# Patient Record
Sex: Female | Born: 1935 | Hispanic: No
Health system: Southern US, Community
[De-identification: ages and names within clinical notes are randomized; demographics above are authoritative.]

---

## 2004-02-08 ENCOUNTER — Other Ambulatory Visit: Payer: Self-pay

## 2004-07-27 ENCOUNTER — Inpatient Hospital Stay: Payer: Self-pay | Admitting: Oncology

## 2004-07-27 ENCOUNTER — Other Ambulatory Visit: Payer: Self-pay

## 2004-08-11 ENCOUNTER — Ambulatory Visit: Payer: Self-pay | Admitting: Oncology

## 2004-08-26 ENCOUNTER — Ambulatory Visit: Payer: Self-pay | Admitting: Oncology

## 2004-09-25 ENCOUNTER — Ambulatory Visit: Payer: Self-pay | Admitting: Oncology

## 2004-10-26 ENCOUNTER — Ambulatory Visit: Payer: Self-pay | Admitting: Oncology

## 2004-12-05 ENCOUNTER — Ambulatory Visit: Payer: Self-pay | Admitting: Oncology

## 2004-12-24 ENCOUNTER — Ambulatory Visit: Payer: Self-pay | Admitting: Oncology

## 2005-01-24 ENCOUNTER — Ambulatory Visit: Payer: Self-pay | Admitting: Oncology

## 2005-02-26 ENCOUNTER — Ambulatory Visit: Payer: Self-pay | Admitting: Oncology

## 2006-01-13 ENCOUNTER — Ambulatory Visit: Payer: Self-pay | Admitting: General Surgery

## 2006-01-20 ENCOUNTER — Ambulatory Visit: Payer: Self-pay | Admitting: Oncology

## 2006-01-24 ENCOUNTER — Ambulatory Visit: Payer: Self-pay | Admitting: Oncology

## 2006-07-23 ENCOUNTER — Ambulatory Visit: Payer: Self-pay | Admitting: Oncology

## 2006-07-26 ENCOUNTER — Ambulatory Visit: Payer: Self-pay | Admitting: Oncology

## 2007-01-31 ENCOUNTER — Ambulatory Visit: Payer: Self-pay | Admitting: Oncology

## 2007-02-24 ENCOUNTER — Ambulatory Visit: Payer: Self-pay | Admitting: Oncology

## 2007-07-27 ENCOUNTER — Ambulatory Visit: Payer: Self-pay | Admitting: Oncology

## 2007-08-25 ENCOUNTER — Ambulatory Visit: Payer: Self-pay | Admitting: Oncology

## 2007-08-27 ENCOUNTER — Ambulatory Visit: Payer: Self-pay | Admitting: Oncology

## 2007-09-26 ENCOUNTER — Ambulatory Visit: Payer: Self-pay | Admitting: Oncology

## 2008-03-05 ENCOUNTER — Ambulatory Visit: Payer: Self-pay | Admitting: Oncology

## 2008-03-26 ENCOUNTER — Ambulatory Visit: Payer: Self-pay | Admitting: Oncology

## 2008-03-29 ENCOUNTER — Ambulatory Visit: Payer: Self-pay | Admitting: Oncology

## 2008-04-25 ENCOUNTER — Ambulatory Visit: Payer: Self-pay | Admitting: Oncology

## 2009-03-26 ENCOUNTER — Ambulatory Visit: Payer: Self-pay | Admitting: Oncology

## 2009-04-03 ENCOUNTER — Ambulatory Visit: Payer: Self-pay | Admitting: Oncology

## 2009-04-25 ENCOUNTER — Ambulatory Visit: Payer: Self-pay | Admitting: Oncology

## 2010-04-25 ENCOUNTER — Ambulatory Visit: Payer: Self-pay | Admitting: Oncology

## 2010-05-14 ENCOUNTER — Ambulatory Visit: Payer: Self-pay | Admitting: Oncology

## 2010-05-16 LAB — CANCER ANTIGEN 27.29: CA 27.29: 15.7 U/mL

## 2010-05-26 ENCOUNTER — Ambulatory Visit: Payer: Self-pay | Admitting: Oncology

## 2010-11-20 ENCOUNTER — Ambulatory Visit: Payer: Self-pay | Admitting: Oncology

## 2010-11-24 LAB — CANCER ANTIGEN 27.29: CA 27.29: 24.1 U/mL (ref 0.0–38.6)

## 2010-11-26 ENCOUNTER — Ambulatory Visit: Payer: Self-pay | Admitting: Oncology

## 2011-08-27 ENCOUNTER — Ambulatory Visit: Payer: Self-pay | Admitting: Oncology

## 2011-08-28 LAB — CANCER ANTIGEN 27.29: CA 27.29: 23 U/mL (ref 0.0–38.6)

## 2011-09-26 ENCOUNTER — Ambulatory Visit: Payer: Self-pay | Admitting: Oncology

## 2012-12-12 LAB — CBC WITH DIFFERENTIAL/PLATELET
Basophil %: 0.9 %
Eosinophil %: 1.4 %
HGB: 10.8 g/dL — ABNORMAL LOW (ref 12.0–16.0)
Lymphocyte #: 1.9 10*3/uL (ref 1.0–3.6)
MCH: 30.8 pg (ref 26.0–34.0)
MCHC: 34 g/dL (ref 32.0–36.0)
MCV: 91 fL (ref 80–100)
Monocyte %: 8.7 %
Neutrophil #: 5.3 10*3/uL (ref 1.4–6.5)
RDW: 13 % (ref 11.5–14.5)
WBC: 8.1 10*3/uL (ref 3.6–11.0)

## 2012-12-12 LAB — URINALYSIS, COMPLETE
Bacteria: NONE SEEN
Bilirubin,UR: NEGATIVE
Glucose,UR: 150 mg/dL (ref 0–75)
Nitrite: NEGATIVE
Ph: 5 (ref 4.5–8.0)
Protein: NEGATIVE
RBC,UR: 3 /HPF (ref 0–5)
Specific Gravity: 1.011 (ref 1.003–1.030)
WBC UR: 10 /HPF (ref 0–5)

## 2012-12-12 LAB — COMPREHENSIVE METABOLIC PANEL
Albumin: 3.3 g/dL — ABNORMAL LOW (ref 3.4–5.0)
Alkaline Phosphatase: 79 U/L (ref 50–136)
BUN: 19 mg/dL — ABNORMAL HIGH (ref 7–18)
Bilirubin,Total: 0.4 mg/dL (ref 0.2–1.0)
Calcium, Total: 9 mg/dL (ref 8.5–10.1)
Chloride: 106 mmol/L (ref 98–107)
Creatinine: 0.81 mg/dL (ref 0.60–1.30)
EGFR (African American): >60
Potassium: 6.4 mmol/L — ABNORMAL HIGH (ref 3.5–5.1)
SGOT(AST): 41 U/L — ABNORMAL HIGH (ref 15–37)
SGPT (ALT): 23 U/L (ref 12–78)
Sodium: 136 mmol/L (ref 136–145)
Total Protein: 7.6 g/dL (ref 6.4–8.2)

## 2012-12-12 LAB — LIPASE, BLOOD: Lipase: 296 U/L (ref 73–393)

## 2012-12-13 ENCOUNTER — Inpatient Hospital Stay: Payer: Self-pay | Admitting: Surgery

## 2012-12-13 LAB — BASIC METABOLIC PANEL
Anion Gap: 8 (ref 7–16)
BUN: 17 mg/dL (ref 7–18)
Calcium, Total: 8.2 mg/dL — ABNORMAL LOW (ref 8.5–10.1)
Chloride: 105 mmol/L (ref 98–107)
Co2: 22 mmol/L (ref 21–32)
Creatinine: 0.93 mg/dL (ref 0.60–1.30)
EGFR (African American): >60
EGFR (Non-African Amer.): 60 — ABNORMAL LOW
Glucose: 274 mg/dL — ABNORMAL HIGH (ref 65–99)
Osmolality: 281 (ref 275–301)
Potassium: 4.3 mmol/L (ref 3.5–5.1)
Sodium: 135 mmol/L — ABNORMAL LOW (ref 136–145)

## 2012-12-14 LAB — CBC WITH DIFFERENTIAL/PLATELET
Basophil #: 0 10*3/uL (ref 0.0–0.1)
Basophil %: 0.6 %
Eosinophil #: 0.1 10*3/uL (ref 0.0–0.7)
Eosinophil %: 2.4 %
HCT: 26.5 % — ABNORMAL LOW (ref 35.0–47.0)
HGB: 8.6 g/dL — ABNORMAL LOW (ref 12.0–16.0)
Lymphocyte #: 1.5 10*3/uL (ref 1.0–3.6)
Lymphocyte %: 32 %
MCH: 30.1 pg (ref 26.0–34.0)
MCHC: 32.6 g/dL (ref 32.0–36.0)
MCV: 92 fL (ref 80–100)
Neutrophil %: 55 %
RBC: 2.87 10*6/uL — ABNORMAL LOW (ref 3.80–5.20)
RDW: 12.7 % (ref 11.5–14.5)
WBC: 4.7 10*3/uL (ref 3.6–11.0)

## 2012-12-14 LAB — BASIC METABOLIC PANEL
Chloride: 115 mmol/L — ABNORMAL HIGH (ref 98–107)
Co2: 23 mmol/L (ref 21–32)
Creatinine: 1 mg/dL (ref 0.60–1.30)
EGFR (African American): >60
Osmolality: 294 (ref 275–301)
Potassium: 4.2 mmol/L (ref 3.5–5.1)
Sodium: 146 mmol/L — ABNORMAL HIGH (ref 136–145)

## 2012-12-15 LAB — URINE CULTURE

## 2014-04-23 ENCOUNTER — Ambulatory Visit: Payer: Self-pay | Admitting: Oncology

## 2014-04-25 ENCOUNTER — Ambulatory Visit: Payer: Self-pay | Admitting: Oncology

## 2014-05-26 ENCOUNTER — Ambulatory Visit: Payer: Self-pay | Admitting: Oncology

## 2014-06-19 IMAGING — CT CT ABD-PELV W/ CM
1 of 2 series · 15 of 32 positions shown, 19 images · IV contrast (isovue)
Comparison: None

REASON FOR EXAM: (1) llq pain acute onset; (2) llq pain
COMMENTS:

PROCEDURE:     CT  - CT ABDOMEN / PELVIS  W  - December 12, 2012 [DATE]
RESULT:     History: Left lower quadrant pain
TECHNIQUE: Multiple axial images of the abdomen and pelvis were performed
from the lung bases to the pubic symphysis, with p.o. contrast and with 80
ml of Isovue 300 intravenous contrast.

[Series 2: 3mm soft tissue · axial · 0.63mm/px · z∈[-407,-77]mm · 15 of 122 slices shown, 19 images]
[im 6/122  soft-tissue]
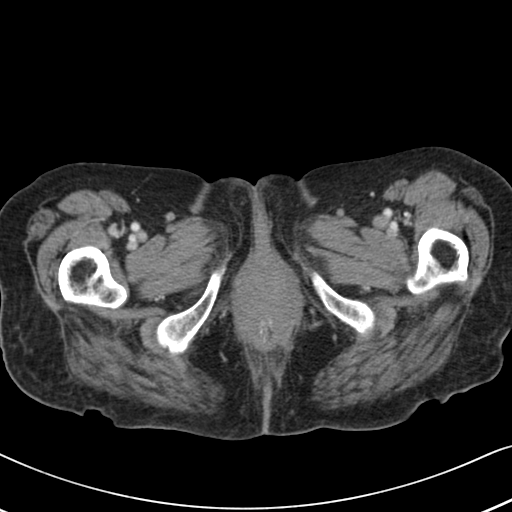
[im 6/122  bone]
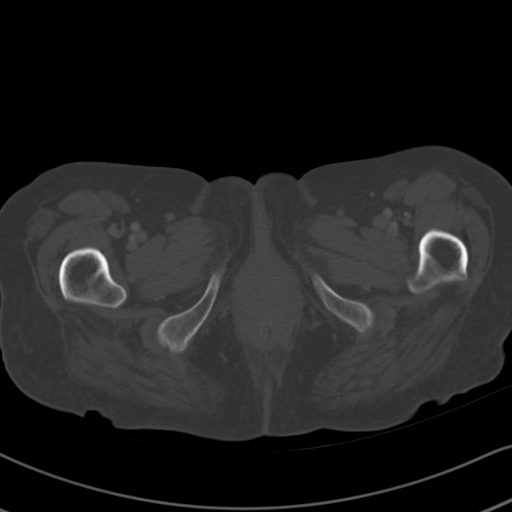
[im 16/122  soft-tissue]
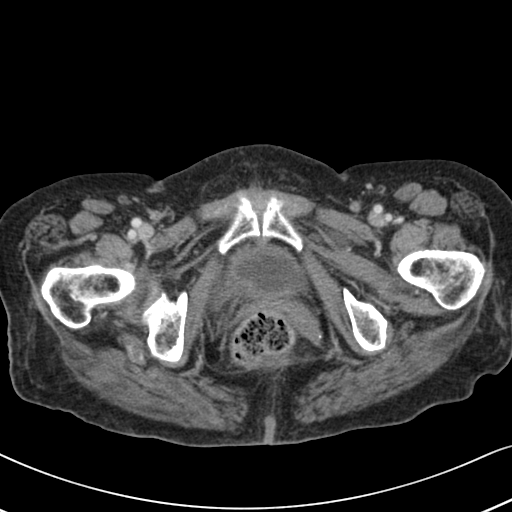
[im 26/122  soft-tissue]
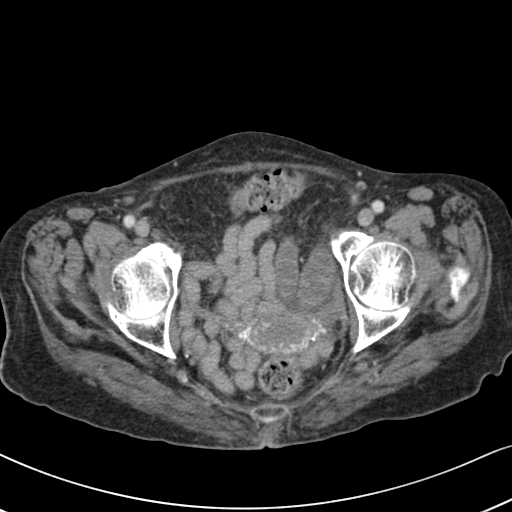
[im 36/122  soft-tissue]
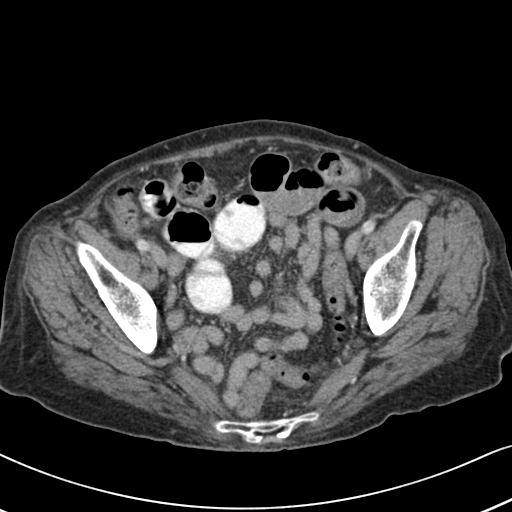
[im 41/122  soft-tissue]
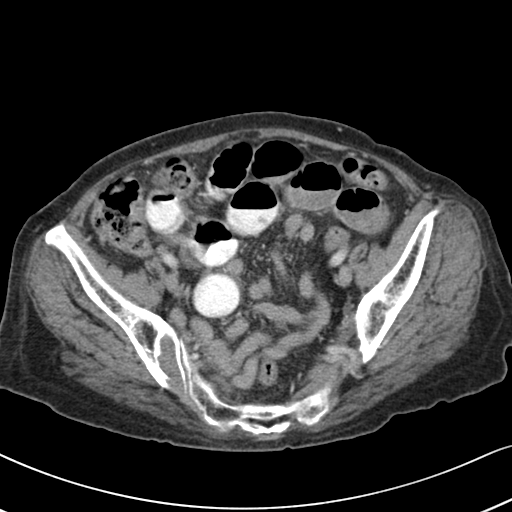
[im 51/122  soft-tissue]
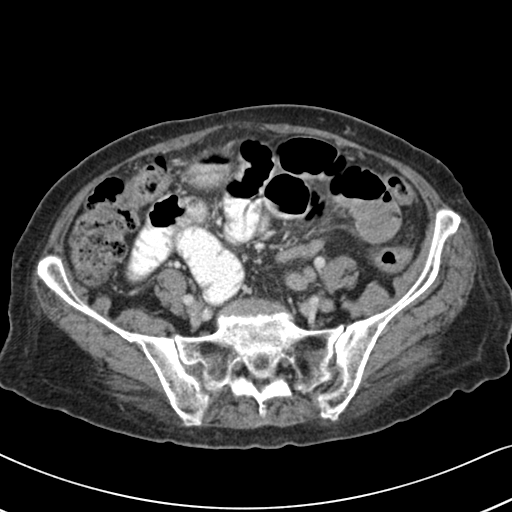
[im 61/122  soft-tissue]
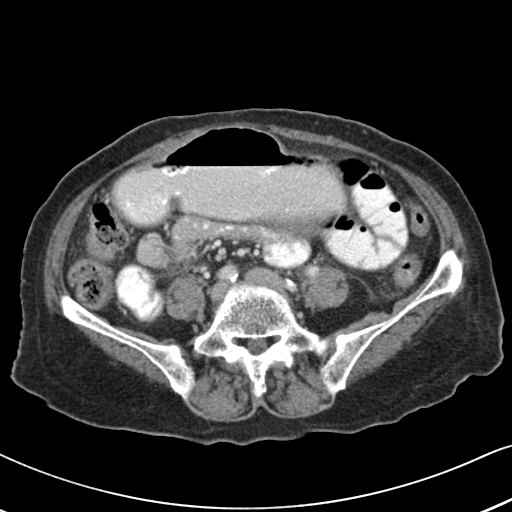
[im 71/122  soft-tissue]
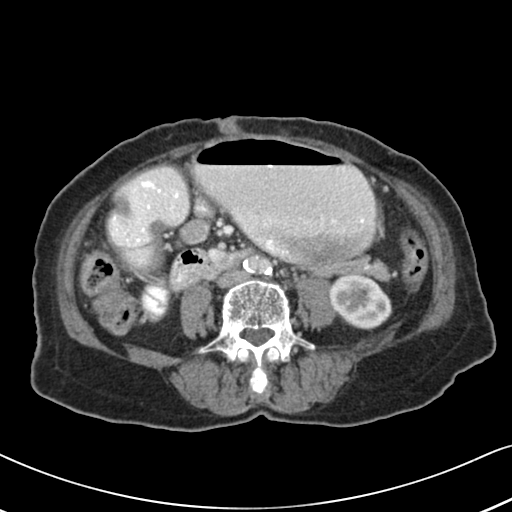
[im 81/122  soft-tissue]
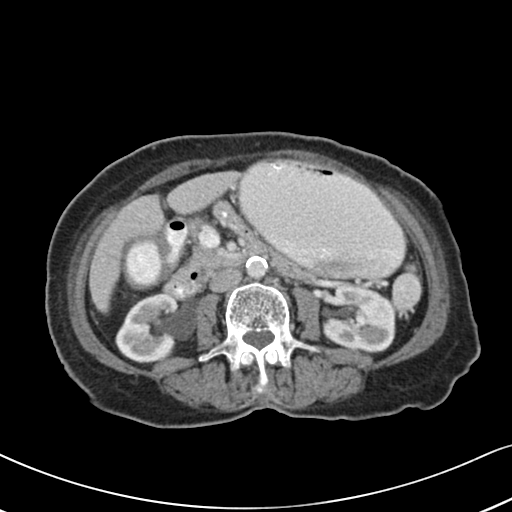
[im 81/122  bone]
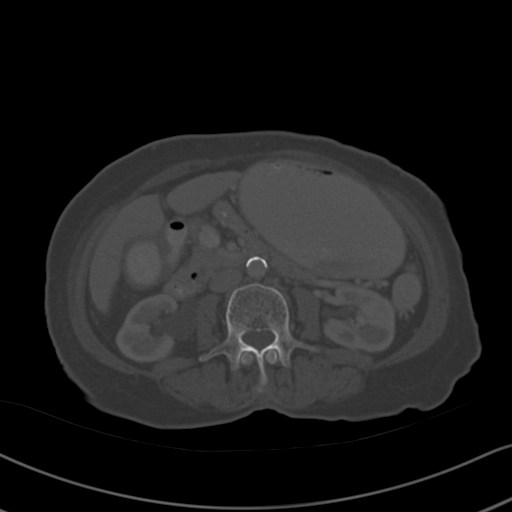
[im 86/122  soft-tissue]
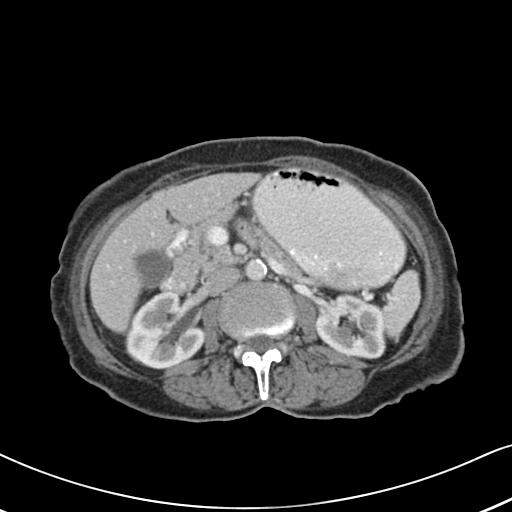
[im 96/122  soft-tissue]
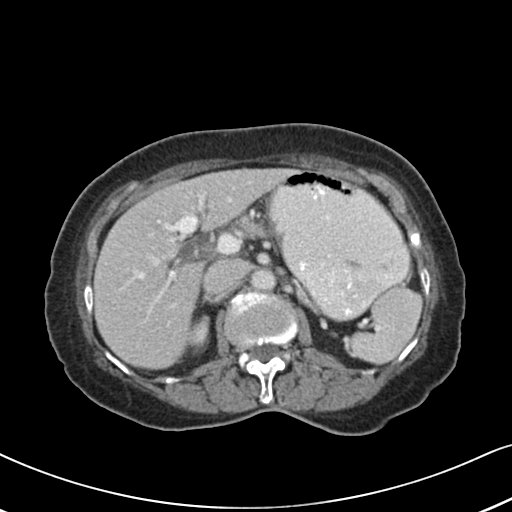
[im 101/122  lung]
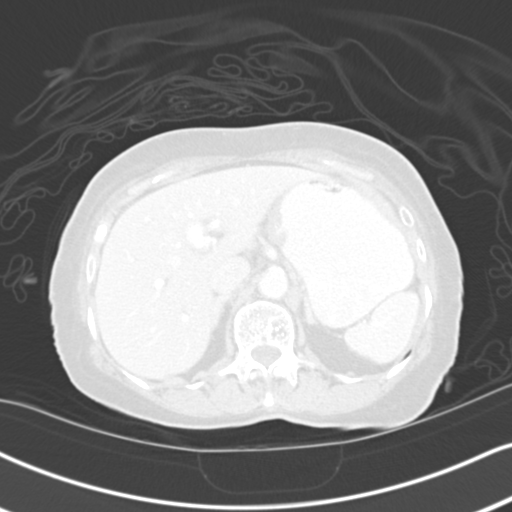
[im 106/122  soft-tissue]
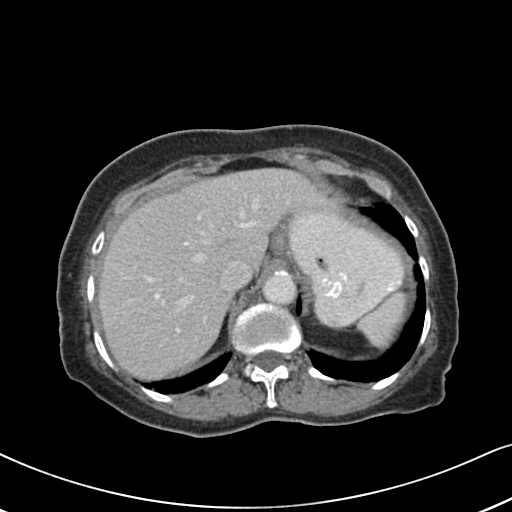
[im 106/122  lung]
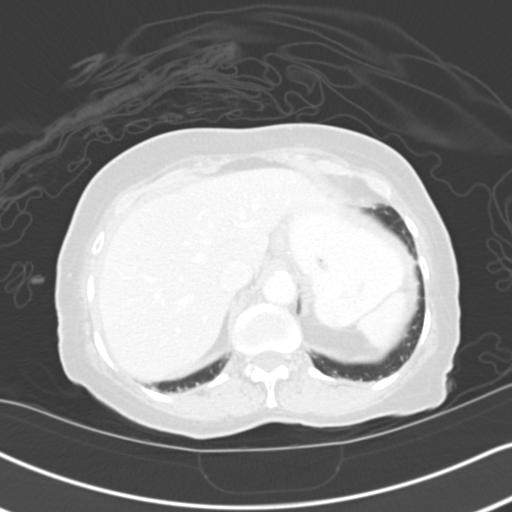
[im 111/122  lung]
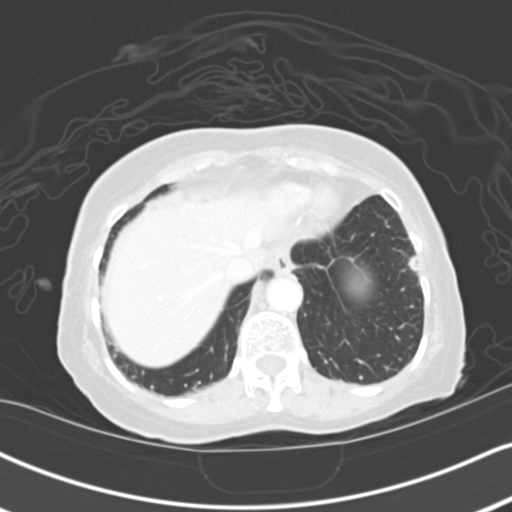
[im 116/122  soft-tissue]
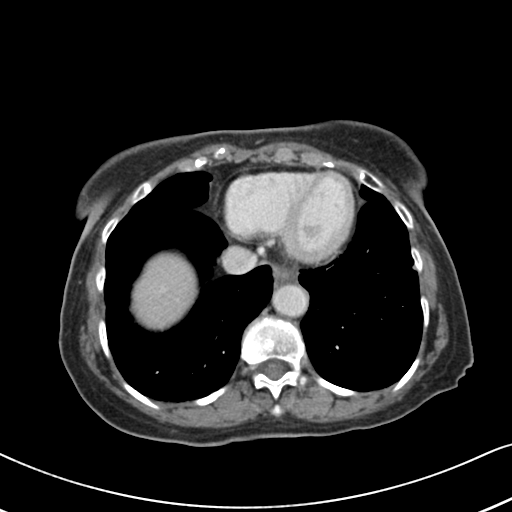
[im 116/122  lung]
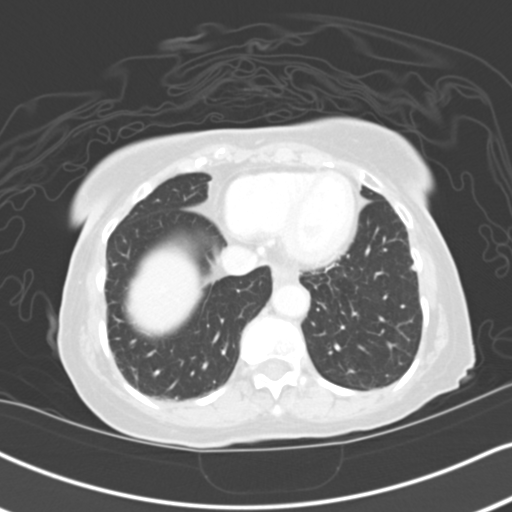

[15 of 32 positions shown; findings below may reference images not displayed]

FINDINGS: The lung bases are clear. There is no pneumothorax. The heart size is
normal.

The liver demonstrates no focal abnormality. There is no intrahepatic or
extrahepatic biliary ductal dilatation. The gallbladder is unremarkable. The
spleen demonstrates no focal abnormality. The kidneys, adrenal glands, and
pancreas are normal. There is relative bladder wall thickening which may
secondary to underdistention versus cystitis.

There is gastric distention. There are multiple dilated loops of small bowel
measuring up to 3 cm with multiple air-fluid levels. The ileum is
decompressed. There is relative bowel wall thickening involving the
ascending and descending colon which may be secondary to under distention
versus colitis secondary to an infectious or inflammatory etiology. There is
no pneumoperitoneum, pneumatosis, or portal venous gas. There is no
abdominal or pelvic free fluid. There is no lymphadenopathy.

The abdominal aorta is normal in caliber with atherosclerosis.

The osseous structures are unremarkable.
IMPRESSION: 1.  There is relative bowel wall thickening involving the ascending and
descending colon which may be secondary to underdistention versus colitis
secondary to an infectious or inflammatory etiology.

2. There is gastric distention. There are multiple dilated loops of small
bowel measuring up to 3 cm with multiple air-fluid levels. The ileum is
decompressed. The appearance is concerning for small bowel obstruction.

3.  There is relative bladder wall thickening which may secondary to
underdistention versus cystitis.

[REDACTED]

## 2015-02-15 NOTE — H&P (Signed)
PATIENT NAME:  Shelby Stevens, SPURRIER MR#:  [id number] DATE OF BIRTH:  [birth year]  DATE OF ADMISSION:  12/13/2012  ADMITTING DIAGNOSES:  1.  Small bowel obstruction.  2.  History of breast cancer, remote.  3.  Hypertension.  4.  Diet-controlled diabetes.  5.  Left hip and leg pain.   HISTORY: This is a 81 year old Asian female, who presented to the Emergency Room with the sudden onset of lower abdominal pain earlier this evening following dinner. She had no prodrome of abdominal pain, nausea or vomiting. She had a bowel movement this morning. She had passage of gas earlier today. She does admit to having upper respiratory tract infection for which she was placed on antibiotics by her local doctor within the last week. There has been no sick contacts in the family regarding GI illness. The patient had a CT scan with oral contrast in the Emergency Room and following this had emesis of a small amount. She did get some morphine in the Emergency Room and is feeling better. CT scan returned concerning for bowel obstruction and as such surgical services were consulted.   In reviewing her history, the patient has a history of breast cancer treated with lumpectomy, chemotherapy and radiation approximately 9 years ago. This was done by Dr. Evette Cristal. She recently saw Dr. Doylene Canning and seems to be in clinical remission. She admits to no previous abdominal operations. She has had childbirth, all of which was natural. She recently came back from a long trip to [place]. The patient states that she developed left leg and hip pain as well as lower back pain along with the abdominal pain earlier this evening.   ALLERGIES: None.   MEDICATIONS:  1.  Aspirin 81 mg by mouth once a day.  2.  Atenolol 12.5 mg by mouth once a day. 3.  Lipitor 20 mg by mouth once a day. 4.  Lisinopril 2.5 mg by mouth once a day. 5.  Multivitamins with iron 1 tab p.o. daily. 6.  Os-Cal 500 plus D 1 tab t.i.d.   PAST MEDICAL HISTORY:  1.  Breast  cancer.  2.  Hypertension.  3.  Hypercholesterolemia.   PAST SURGICAL HISTORY: Left breast partial mastectomy and sentinel lymph node biopsy in April 2005. Cardiac catheterization in 2004 demonstrating ejection fraction of 65% and insignificant coronary artery disease.   SOCIAL HISTORY: The patient does not drink or smoke. She lives with her daughter here in town. She is mother-in-law of one of the local physicians.   FAMILY HISTORY: Noncontributory.  REVIEW OF SYSTEMS: As described above and as per 10-point review, otherwise unremarkable.   PHYSICAL EXAMINATION:  GENERAL: Frail appearing, Asian female accompanied by her daughters.  VITAL SIGNS: Temperature 97.6, pulse 55 and regular, respiratory rate 22 and unlabored, blood pressure 174/71, room air saturation 98%. She is 5 feet 1 inch, weight 95 pounds. BMI 17.9. NECK: Without adenopathy or thyromegaly. No supraclavicular lymphadenopathy is noted.  LUNGS: Clear.  HEART: Regular rate and rhythm. No murmurs.  ABDOMEN: Demonstrates no scars. No obvious hernias. There is high pitched bowel sounds in all 4 quadrants. There is minimal tenderness. There is tympany. There is distention. No peritoneal signs. No visible peristalsis was noted.  EXTREMITIES: Warm and well perfused. Full range of motion of the left ankle, hip and knee. No paresthesias were noted.  RECTAL/GENITOURINARY: Deferred. Examination of her groin demonstrates no evidence of obvious inguinal or femoral hernia.  SKIN: Warm and well perfused. Sclerae are anicteric. Extraocular muscles are intact.  NEUROLOGIC: Grossly normal.  PSYCHIATRIC: Appropriate mood, judgment and affect.   LABORATORY VALUES: Glucose 259, BUN 19, creatinine 0.81, sodium 136, potassium 6.4, chloride 106, CO2 24, lipase 296. The specimen was slightly hemolyzed and is being repeated. Albumin 3.3, total bilirubin 0.4, alkaline phosphatase 79, AST 41, ALT 23. White count is 8.1, hemoglobin 10.8, hematocrit 31.9,  platelet count 252.000, normal differential. Personal review of the CT scan demonstrates massively distended stomach with air and contrast. There are multiple dilated loops of small intestine measuring up to 3 cm with multiple air fluid levels. The entire ileum it appears to be decompressed as well as the right colon and transverse colon. There is no evidence of pneumoperitoneum, portal venous gas, free fluid or pneumatosis intestinalis. There is evidence of calcifications within the uterus. The bladder wall appears to be slightly thickened. Urinalysis: 2+ LE, 10 WBCs per high-power field, no bacteria seen, protein negative, nitrite negative and no red cells.   IMPRESSION:  1.  This is a 81 year old Asian female with sudden onset of abdominal pain and, what appears to be, a small bowel obstruction seen on CT scan in the absence of previous abdominal operations. Differential diagnosis includes ileus, adhesive bowel obstruction from congenital adhesion or acquired intra-abdominal adhesions or internal hernia.  2.  Left leg, groin and lower back pain of unclear etiology.  3.  Hyperkalemia, which I think is a results as a result of hemolysis.  4.  Diabetes.  5.  Hypertension.   PLAN: The patient will be admitted. A nasogastric tube will be placed. I will repeat her complete metabolic panel in the Emergency Room. I believe that the potassium is falsely elevated. At any rate, the patient will be placed on telemetry and an EKG will be obtained. If her potassium is indeed of truly elevated origin, this will need to be corrected medically. We will hold any further potassium infusions at this time. I discussed with her and her family goals of therapy with a nasogastric tube and repeating her x-rays in the morning. Surgical intervention will depend on clinical course.   TOTAL TIME SPENT: 60 minutes.   ____________________________ Redge GainerMark A. Egbert GaribaldiBird, MD mab:aw D: 12/13/2012 01:11:00 ET T: 12/13/2012 08:59:22  ET JOB#: [id number]  cc: Loraine LericheMark A. Egbert GaribaldiBird, MD, <Dictator> Raynald KempMARK A Leovardo Thoman MD ELECTRONICALLY SIGNED 12/15/2012 14:21

## 2015-02-24 DEATH — deceased
